# Patient Record
Sex: Female | Born: 2000 | Race: Black or African American | Hispanic: No | Marital: Single | Smoking: Never smoker
Health system: Southern US, Community
[De-identification: ages and names within clinical notes are randomized; demographics above are authoritative.]

---

## 2019-04-30 ENCOUNTER — Encounter (HOSPITAL_COMMUNITY): Payer: Self-pay | Admitting: Emergency Medicine

## 2019-04-30 ENCOUNTER — Other Ambulatory Visit: Payer: Self-pay

## 2019-04-30 ENCOUNTER — Emergency Department (HOSPITAL_COMMUNITY)
Admission: EM | Admit: 2019-04-30 | Discharge: 2019-04-30 | Disposition: A | Discharge status: Home / Self Care | Payer: Self-pay | Attending: Emergency Medicine | Admitting: Emergency Medicine

## 2019-04-30 ENCOUNTER — Emergency Department (HOSPITAL_COMMUNITY): Payer: Self-pay

## 2019-04-30 DIAGNOSIS — R55 Syncope and collapse: Secondary | ICD-10-CM | POA: Insufficient documentation

## 2019-04-30 DIAGNOSIS — Z888 Allergy status to other drugs, medicaments and biological substances status: Secondary | ICD-10-CM | POA: Insufficient documentation

## 2019-04-30 DIAGNOSIS — Z91013 Allergy to seafood: Secondary | ICD-10-CM | POA: Insufficient documentation

## 2019-04-30 LAB — CBC
HCT: 38.4 % (ref 36.0–46.0)
Hemoglobin: 12.1 g/dL (ref 12.0–15.0)
MCH: 28.3 pg (ref 26.0–34.0)
MCHC: 31.5 g/dL (ref 30.0–36.0)
MCV: 89.9 fL (ref 80.0–100.0)
Platelets: 262 10*3/uL (ref 150–400)
RBC: 4.27 MIL/uL (ref 3.87–5.11)
RDW: 14.7 % (ref 11.5–15.5)
WBC: 6.5 10*3/uL (ref 4.0–10.5)
nRBC: 0 % (ref 0.0–0.2)

## 2019-04-30 LAB — URINALYSIS, ROUTINE W REFLEX MICROSCOPIC
Bacteria, UA: NONE SEEN
Bilirubin Urine: NEGATIVE
Glucose, UA: NEGATIVE mg/dL
Ketones, ur: NEGATIVE mg/dL
Nitrite: NEGATIVE
Protein, ur: NEGATIVE mg/dL
Specific Gravity, Urine: 1.021 (ref 1.005–1.030)
pH: 6 (ref 5.0–8.0)

## 2019-04-30 LAB — BASIC METABOLIC PANEL
Anion gap: 10 (ref 5–15)
BUN: 10 mg/dL (ref 6–20)
CO2: 24 mmol/L (ref 22–32)
Calcium: 9.1 mg/dL (ref 8.9–10.3)
Chloride: 104 mmol/L (ref 98–111)
Creatinine, Ser: 0.64 mg/dL (ref 0.44–1.00)
GFR calc Af Amer: >60 mL/min (ref >60)
GFR calc non Af Amer: >60 mL/min (ref >60)
Glucose, Bld: 98 mg/dL (ref 70–99)
Potassium: 3.9 mmol/L (ref 3.5–5.1)
Sodium: 138 mmol/L (ref 135–145)

## 2019-04-30 LAB — D-DIMER, QUANTITATIVE: D-Dimer, Quant: 0.35 ug/mL-FEU (ref 0.00–0.50)

## 2019-04-30 LAB — I-STAT BETA HCG BLOOD, ED (MC, WL, AP ONLY): I-stat hCG, quantitative: <5 m[IU]/mL (ref <5)

## 2019-04-30 LAB — CBG MONITORING, ED: Glucose-Capillary: 96 mg/dL (ref 70–99)

## 2019-04-30 MED ORDER — SODIUM CHLORIDE 0.9% FLUSH: 3.0000 mL | Freq: Once | INTRAVENOUS | Status: DC

## 2019-04-30 MED ORDER — IBUPROFEN 800 MG PO TABS
800.0000 mg | ORAL_TABLET | Freq: Once | ORAL | Status: AC
  Administered 2019-04-30: 07:00:00 800 mg via ORAL
  Filled 2019-04-30: qty 1

## 2019-04-30 NOTE — ED Provider Notes (Signed)
[facility]-EMERGENCY DEPT Provider Note   CSN: [id number] Arrival date & time: 04/30/19  [id number]     History Chief Complaint  Patient presents with  . Near Syncope    Kristine Foster is a 18 y.o. female with a hx of no major medical problems presents to the Emergency Department complaining of gradual, persistent, progressively worsening episodes of near syncope.  Patient reports she has been experiencing episodes for approximately 5 months.  She reports in the last few weeks they have become more frequent.  She reports that she has had approximately 4 episodes per week for the last several weeks.  She states they are worse with position changes and bending over.  She states when this happens she feels palpitations and often sees spots.  She has had no full syncopal episodes.  Episodes are worse around the time of her menstrual cycle.  They do not seem to be affected by weather or food intake.  She reports drinking 12 to 24 ounces of water per day.  She works for [employer] reports this is a physical job with recurrent bending.  She denies recent illness.  No viral illnesses.  She denies fever, chills, headache, vision changes, double vision, neck pain, neck stiffness, chest pain, shortness of breath, abdominal pain, nausea, vomiting, diarrhea, weakness, dysuria, hematuria.  She does report some associated left-sided chest pain which is worse with movement and inspiration.  She reports this has been ongoing for couple of weeks.  She does not take birth control, has no peripheral edema, no recent surgeries or periods of immobilization.  No history of DVT/PE.  The history is provided by the patient and medical records. No language interpreter was used.       History reviewed. No pertinent past medical history.  There are no problems to display for this patient.   History reviewed. No pertinent surgical history.   OB History   No obstetric history on file.     History  reviewed. No pertinent family history.  Social History   Tobacco Use  . Smoking status: Never Smoker  . Smokeless tobacco: Never Used  Substance Use Topics  . Alcohol use: Not Currently  . Drug use: Not Currently    Home Medications Prior to Admission medications   Not on File    Allergies    Diphenhydramine hcl and Shellfish allergy  Review of Systems   Review of Systems  Constitutional: Negative for appetite change, diaphoresis, fatigue, fever and unexpected weight change.  HENT: Negative for mouth sores.   Eyes: Negative for visual disturbance.  Respiratory: Negative for cough, chest tightness, shortness of breath and wheezing.   Cardiovascular: Negative for chest pain.  Gastrointestinal: Negative for abdominal pain, constipation, diarrhea, nausea and vomiting.  Endocrine: Negative for polydipsia, polyphagia and polyuria.  Genitourinary: Negative for dysuria, frequency, hematuria and urgency.  Musculoskeletal: Negative for back pain and neck stiffness.  Skin: Negative for rash.  Allergic/Immunologic: Negative for immunocompromised state.  Neurological: Positive for light-headedness. Negative for syncope and headaches.  Hematological: Does not bruise/bleed easily.  Psychiatric/Behavioral: Negative for sleep disturbance. The patient is not nervous/anxious.     Physical Exam Updated Vital Signs BP 127/82   Pulse 74   Temp 98.5 F (36.9 C) (Oral)   Resp (!) 21   Ht 5\' 2"  (1.575 m)   Wt 61.2 kg   LMP 04/06/2019   SpO2 99%   BMI 24.69 kg/m   Physical Exam Vitals and nursing note reviewed.  Constitutional:  General: She is not in acute distress.    Appearance: She is not diaphoretic.  HENT:     Head: Normocephalic.  Eyes:     General: No scleral icterus.    Conjunctiva/sclera: Conjunctivae normal.  Cardiovascular:     Rate and Rhythm: Normal rate and regular rhythm.     Pulses: Normal pulses.          Radial pulses are 2+ on the right side and 2+ on  the left side.  Pulmonary:     Effort: No tachypnea, accessory muscle usage, prolonged expiration, respiratory distress or retractions.     Breath sounds: No stridor.     Comments: Equal chest rise. No increased work of breathing. Abdominal:     General: There is no distension.     Palpations: Abdomen is soft.     Tenderness: There is no abdominal tenderness. There is no guarding or rebound.  Musculoskeletal:     Cervical back: Normal range of motion.     Right lower leg: Normal. No swelling. No edema.     Left lower leg: Normal. No swelling. No edema.     Comments: Moves all extremities equally and without difficulty.  Skin:    General: Skin is warm and dry.     Capillary Refill: Capillary refill takes less than 2 seconds.  Neurological:     Mental Status: She is alert.     GCS: GCS eye subscore is 4. GCS verbal subscore is 5. GCS motor subscore is 6.     Comments: Speech is clear and goal oriented.  Psychiatric:        Mood and Affect: Mood normal.     ED Results / Procedures / Treatments   Labs (all labs ordered are listed, but only abnormal results are displayed) Labs Reviewed  URINALYSIS, ROUTINE W REFLEX MICROSCOPIC - Abnormal; Notable for the following components:      Result Value   Hgb urine dipstick SMALL (*)    Leukocytes,Ua TRACE (*)    All other components within normal limits  BASIC METABOLIC PANEL  CBC  D-DIMER, QUANTITATIVE (NOT AT [facility])  CBG MONITORING, ED  CBG MONITORING, ED  I-STAT BETA HCG BLOOD, ED ([facility], [facility], [facility] ONLY)    EKG EKG Interpretation  Date/Time:  Saturday April 30 2019 04:12:39 EST Ventricular Rate:  75 PR Interval:    QRS Duration: 93 QT Interval:  374 QTC Calculation: 418 R Axis:   88 Text Interpretation: Sinus rhythm Borderline short PR interval Confirmed by Orpah Greek [phone]) on 04/30/2019 6:25:19 AM   Radiology DG Chest 2 View  Result Date: 04/30/2019 CLINICAL DATA:  Rib pain EXAM: CHEST - 2 VIEW COMPARISON:   None. FINDINGS: The heart size and mediastinal contours are within normal limits. Both lungs are clear. The visualized skeletal structures are unremarkable. IMPRESSION: No active cardiopulmonary disease. Electronically Signed   By: Ulyses Jarred M.D.   On: 04/30/2019 05:47    Procedures Procedures (including critical care time)  Medications Ordered in ED Medications  sodium chloride flush (NS) 0.9 % injection 3 mL (has no administration in time range)  ibuprofen (ADVIL) tablet 800 mg (800 mg Oral Given 04/30/19 8127)    ED Course  I have reviewed the triage vital signs and the nursing notes.  Pertinent labs & imaging results that were available during my care of the patient were reviewed by me and considered in my medical decision making (see chart for details).  Clinical Course as of  Apr 29 718  Sat Apr 30, 2019  0092 No tachycardia  Pulse Rate: 75 [HM]  0512 No hypoxia  SpO2: 100 % [HM]    Clinical Course User Index [HM] Saloma Cadena, Boyd Kerbs   MDM Rules/Calculators/A&P          This score is not applicable to this patient. Components are not  calculated.                    Patient presents with intermittent episodes of near syncope.  No orthostasis tonight.  She is well-appearing.  No tachycardia or hypotension.  EKG without acute abnormalities.  No evidence of acute acute coronary syndrome.  No arrhythmias here in the emergency department.  Orthostatic VS for the past 24 hrs:  BP- Lying Pulse- Lying BP- Sitting Pulse- Sitting BP- Standing at 0 minutes Pulse- Standing at 0 minutes  04/30/19 0518 126/70 70 127/72 82 137/80 94   Patient did complain of some left-sided chest pain.  She is low risk for DVT/PE.  Clinically without signs and symptoms.  D-dimer is negative.  Chest x-ray without mass, pulmonary edema, cardiomegaly, pleural effusion, pneumonia.  I personally evaluated these images.  Labs are otherwise reassuring.  No electrolyte abnormalities.  No evidence  of infection.  No signs of dehydration.  Suspect etiology is POTS or similar etiology.  Will refer to cardiology for further evaluation.  Discussed reasons to return to the emergency department.  Patient states understanding and is in agreement with the plan.  Final Clinical Impression(s) / ED Diagnoses Final diagnoses:  Near syncope    Rx / DC Orders ED Discharge Orders    None       Yasmyn Bellisario, Boyd Kerbs 04/30/19 0719    Gilda Crease, MD 05/05/19 [phone]

## 2019-04-30 NOTE — ED Notes (Signed)
Save blue tube in main lab °

## 2019-04-30 NOTE — ED Notes (Addendum)
PT ambulate to and from triage restroom no light head or dizziness

## 2019-04-30 NOTE — ED Notes (Signed)
Pt made aware of urine sample 

## 2019-04-30 NOTE — ED Notes (Signed)
PA at bedside.

## 2019-04-30 NOTE — ED Triage Notes (Signed)
Patient is complaining of not being able to get sleep at night and dizzy to the point of almost passing out when she do normal activities. Patient is complaining of left rib cage pain that started yesterday. Patient states all the other symptoms started a week ago.

## 2019-04-30 NOTE — Discharge Instructions (Addendum)
1. Medications: usual home medications 2. Treatment: rest, drink plenty of fluids -2-3 L/day 3. Follow Up: Please followup with cardiology for further evaluation of your symptoms; Please return to the ER for syncope, chest pain, difficulty breathing, high fevers or other concerns.

## 2020-04-06 ENCOUNTER — Ambulatory Visit: Payer: Self-pay

## 2020-08-28 ENCOUNTER — Encounter (HOSPITAL_COMMUNITY): Payer: Self-pay

## 2020-08-28 ENCOUNTER — Emergency Department (HOSPITAL_COMMUNITY): Payer: BC Managed Care – PPO

## 2020-08-28 ENCOUNTER — Other Ambulatory Visit: Payer: Self-pay

## 2020-08-28 ENCOUNTER — Emergency Department (HOSPITAL_COMMUNITY)
Admission: EM | Admit: 2020-08-28 | Discharge: 2020-08-28 | Disposition: A | Discharge status: Home / Self Care | Payer: BC Managed Care – PPO | Attending: Emergency Medicine | Admitting: Emergency Medicine

## 2020-08-28 DIAGNOSIS — S6991XA Unspecified injury of right wrist, hand and finger(s), initial encounter: Secondary | ICD-10-CM | POA: Diagnosis present

## 2020-08-28 DIAGNOSIS — S62306A Unspecified fracture of fifth metacarpal bone, right hand, initial encounter for closed fracture: Secondary | ICD-10-CM | POA: Insufficient documentation

## 2020-08-28 DIAGNOSIS — S6291XA Unspecified fracture of right wrist and hand, initial encounter for closed fracture: Secondary | ICD-10-CM

## 2020-08-28 MED ORDER — ACETAMINOPHEN 500 MG PO TABS
1000.0000 mg | ORAL_TABLET | Freq: Once | ORAL | Status: AC
  Administered 2020-08-28: 1000 mg via ORAL
  Filled 2020-08-28: qty 2

## 2020-08-28 NOTE — ED Notes (Signed)
An After Visit Summary was printed and given to the patient. Discharge instructions given and no further questions at this time.  

## 2020-08-28 NOTE — Progress Notes (Signed)
Orthopedic Tech Progress Note Patient Details:  Kristine Foster [birth year] [id number]  Ortho Devices Type of Ortho Device: Ace wrap,Ulna gutter splint Ortho Device/Splint Location: right Ortho Device/Splint Interventions: Application   Post Interventions Patient Tolerated: Well Instructions Provided: Care of device   Saul Fordyce 08/28/2020, 2:09 PM
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
## 2020-08-28 NOTE — Discharge Instructions (Addendum)
You may alternate Tylenol or ibuprofen in order to help with your symptoms.  We discussed results of your x-ray, you were provided with a copy of this.  Your right hand was placed on a splint, please keep this clean and dry in order to help with your symptoms.  Dr. Merry Proud number is attached to your chart, please schedule an appointment for evaluation of your right hand fracture.
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
